# Patient Record
Sex: Male | Born: 1955 | Race: White | Hispanic: No | Marital: Married | Smoking: Never smoker
Health system: Southern US, Community
[De-identification: ages and names within clinical notes are randomized; demographics above are authoritative.]

## PROBLEM LIST (undated history)

## (undated) DIAGNOSIS — E7800 Pure hypercholesterolemia, unspecified: Secondary | ICD-10-CM

## (undated) DIAGNOSIS — F419 Anxiety disorder, unspecified: Secondary | ICD-10-CM

## (undated) DIAGNOSIS — K219 Gastro-esophageal reflux disease without esophagitis: Secondary | ICD-10-CM

## (undated) HISTORY — PX: FOOT SURGERY: SHX648

---

## 2013-10-12 ENCOUNTER — Emergency Department (HOSPITAL_BASED_OUTPATIENT_CLINIC_OR_DEPARTMENT_OTHER): Payer: BC Managed Care – PPO

## 2013-10-12 ENCOUNTER — Emergency Department (HOSPITAL_BASED_OUTPATIENT_CLINIC_OR_DEPARTMENT_OTHER)
Admission: EM | Admit: 2013-10-12 | Discharge: 2013-10-12 | Disposition: A | Discharge status: Home / Self Care | Payer: BC Managed Care – PPO | Attending: Emergency Medicine | Admitting: Emergency Medicine

## 2013-10-12 ENCOUNTER — Encounter (HOSPITAL_BASED_OUTPATIENT_CLINIC_OR_DEPARTMENT_OTHER): Payer: Self-pay | Admitting: Emergency Medicine

## 2013-10-12 DIAGNOSIS — K219 Gastro-esophageal reflux disease without esophagitis: Secondary | ICD-10-CM | POA: Insufficient documentation

## 2013-10-12 DIAGNOSIS — Z79899 Other long term (current) drug therapy: Secondary | ICD-10-CM | POA: Insufficient documentation

## 2013-10-12 DIAGNOSIS — Z880 Allergy status to penicillin: Secondary | ICD-10-CM | POA: Insufficient documentation

## 2013-10-12 DIAGNOSIS — R1013 Epigastric pain: Secondary | ICD-10-CM | POA: Insufficient documentation

## 2013-10-12 DIAGNOSIS — F411 Generalized anxiety disorder: Secondary | ICD-10-CM | POA: Insufficient documentation

## 2013-10-12 HISTORY — DX: Anxiety disorder, unspecified: F41.9

## 2013-10-12 HISTORY — DX: Pure hypercholesterolemia, unspecified: E78.00

## 2013-10-12 HISTORY — DX: Gastro-esophageal reflux disease without esophagitis: K21.9

## 2013-10-12 LAB — COMPREHENSIVE METABOLIC PANEL
ALBUMIN: 3.6 g/dL (ref 3.5–5.2)
ALT: 33 U/L (ref 0–53)
ANION GAP: 12 (ref 5–15)
AST: 77 U/L — ABNORMAL HIGH (ref 0–37)
Alkaline Phosphatase: 102 U/L (ref 39–117)
BUN: 12 mg/dL (ref 6–23)
CALCIUM: 9.1 mg/dL (ref 8.4–10.5)
CO2: 25 mEq/L (ref 19–32)
Chloride: 104 mEq/L (ref 96–112)
Creatinine, Ser: 1.2 mg/dL (ref 0.50–1.35)
GFR calc Af Amer: 76 mL/min — ABNORMAL LOW (ref >90)
GFR calc non Af Amer: 65 mL/min — ABNORMAL LOW (ref >90)
Glucose, Bld: 118 mg/dL — ABNORMAL HIGH (ref 70–99)
Potassium: 3.6 mEq/L — ABNORMAL LOW (ref 3.7–5.3)
SODIUM: 141 meq/L (ref 137–147)
TOTAL PROTEIN: 7.2 g/dL (ref 6.0–8.3)
Total Bilirubin: 0.4 mg/dL (ref 0.3–1.2)

## 2013-10-12 LAB — URINALYSIS, ROUTINE W REFLEX MICROSCOPIC
Bilirubin Urine: NEGATIVE
Glucose, UA: NEGATIVE mg/dL
Hgb urine dipstick: NEGATIVE
KETONES UR: NEGATIVE mg/dL
Leukocytes, UA: NEGATIVE
NITRITE: NEGATIVE
Protein, ur: NEGATIVE mg/dL
SPECIFIC GRAVITY, URINE: 1.017 (ref 1.005–1.030)
Urobilinogen, UA: 0.2 mg/dL (ref 0.0–1.0)
pH: 6.5 (ref 5.0–8.0)

## 2013-10-12 LAB — CBC WITH DIFFERENTIAL/PLATELET
BASOS ABS: 0 10*3/uL (ref 0.0–0.1)
BASOS PCT: 0 % (ref 0–1)
EOS ABS: 0 10*3/uL (ref 0.0–0.7)
EOS PCT: 0 % (ref 0–5)
HCT: 36.1 % — ABNORMAL LOW (ref 39.0–52.0)
Hemoglobin: 12 g/dL — ABNORMAL LOW (ref 13.0–17.0)
Lymphocytes Relative: 25 % (ref 12–46)
Lymphs Abs: 1.1 10*3/uL (ref 0.7–4.0)
MCH: 29.4 pg (ref 26.0–34.0)
MCHC: 33.2 g/dL (ref 30.0–36.0)
MCV: 88.5 fL (ref 78.0–100.0)
Monocytes Absolute: 0.4 10*3/uL (ref 0.1–1.0)
Monocytes Relative: 9 % (ref 3–12)
NEUTROS PCT: 66 % (ref 43–77)
Neutro Abs: 3 10*3/uL (ref 1.7–7.7)
Platelets: 179 10*3/uL (ref 150–400)
RBC: 4.08 MIL/uL — AB (ref 4.22–5.81)
RDW: 13.6 % (ref 11.5–15.5)
WBC: 4.5 10*3/uL (ref 4.0–10.5)

## 2013-10-12 LAB — LIPASE, BLOOD: Lipase: 31 U/L (ref 11–59)

## 2013-10-12 LAB — TROPONIN I
Troponin I: <0.3 ng/mL (ref <0.30)
Troponin I: <0.3 ng/mL (ref <0.30)

## 2013-10-12 MED ORDER — OMEPRAZOLE 20 MG PO CPDR: 20.0000 mg | DELAYED_RELEASE_CAPSULE | Freq: Every day | ORAL | Status: AC

## 2013-10-12 MED ORDER — GI COCKTAIL ~~LOC~~
30.0000 mL | Freq: Once | ORAL | Status: AC
  Administered 2013-10-12: 30 mL via ORAL
  Filled 2013-10-12: qty 30

## 2013-10-12 MED ORDER — ONDANSETRON HCL 4 MG PO TABS: 4.0000 mg | ORAL_TABLET | Freq: Four times a day (QID) | ORAL | Status: AC

## 2013-10-12 MED ORDER — PANTOPRAZOLE SODIUM 40 MG IV SOLR
40.0000 mg | Freq: Once | INTRAVENOUS | Status: AC
  Administered 2013-10-12: 40 mg via INTRAVENOUS
  Filled 2013-10-12: qty 40

## 2013-10-12 MED ORDER — SODIUM CHLORIDE 0.9 % IV BOLUS (SEPSIS)
1000.0000 mL | Freq: Once | INTRAVENOUS | Status: AC
  Administered 2013-10-12: 1000 mL via INTRAVENOUS

## 2013-10-12 MED ORDER — SUCRALFATE 1 G PO TABS: 1.0000 g | ORAL_TABLET | Freq: Three times a day (TID) | ORAL | Status: AC

## 2013-10-12 NOTE — Discharge Instructions (Signed)
Peptic Ulcer Continue your stomach medications and follow up with your stomach doctor. Return to the ED if you develop chest pain, shortness of breath, worsening abdominal pain, or any other concerns. A peptic ulcer is a sore in the lining of in your esophagus (esophageal ulcer), stomach (gastric ulcer), or in the first part of your small intestine (duodenal ulcer). The ulcer causes erosion into the deeper tissue. CAUSES  Normally, the lining of the stomach and the small intestine protects itself from the acid that digests food. The protective lining can be damaged by:  An infection caused by a bacterium called Helicobacter pylori (H. pylori).  Regular use of nonsteroidal anti-inflammatory drugs (NSAIDs), such as ibuprofen or aspirin.  Smoking tobacco. Other risk factors include being older than 50, drinking alcohol excessively, and having a family history of ulcer disease.  SYMPTOMS   Burning pain or gnawing in the area between the chest and the belly button.  Heartburn.  Nausea and vomiting.  Bloating. The pain can be worse on an empty stomach and at night. If the ulcer results in bleeding, it can cause:  Black, tarry stools.  Vomiting of bright red blood.  Vomiting of coffee ground looking materials. DIAGNOSIS  A diagnosis is usually made based upon your history and an exam. Other tests and procedures may be performed to find the cause of the ulcer. Finding a cause will help determine the best treatment. Tests and procedures may include:  Blood tests, stool tests, or breath tests to check for the bacterium H. pylori.  An upper gastrointestinal (GI) series of the esophagus, stomach, and small intestine.  An endoscopy to examine the esophagus, stomach, and small intestine.  A biopsy. TREATMENT  Treatment may include:  Eliminating the cause of the ulcer, such as smoking, NSAIDs, or alcohol.  Medicines to reduce the amount of acid in your digestive tract.  Antibiotic  medicines if the ulcer is caused by the H. pylori bacterium.  An upper endoscopy to treat a bleeding ulcer.  Surgery if the bleeding is severe or if the ulcer created a hole somewhere in the digestive system. HOME CARE INSTRUCTIONS   Avoid tobacco, alcohol, and caffeine. Smoking can increase the acid in the stomach, and continued smoking will impair the healing of ulcers.  Avoid foods and drinks that seem to cause discomfort or aggravate your ulcer.  Only take medicines as directed by your caregiver. Do not substitute over-the-counter medicines for prescription medicines without talking to your caregiver.  Keep any follow-up appointments and tests as directed. SEEK MEDICAL CARE IF:   Your do not improve within 7 days of starting treatment.  You have ongoing indigestion or heartburn. SEEK IMMEDIATE MEDICAL CARE IF:   You have sudden, sharp, or persistent abdominal pain.  You have bloody or dark black, tarry stools.  You vomit blood or vomit that looks like coffee grounds.  You become light headed, weak, or feel faint.  You become sweaty or clammy. MAKE SURE YOU:   Understand these instructions.  Will watch your condition.  Will get help right away if you are not doing well or get worse. Document Released: 03/27/2000 Document Revised: 12/23/2011 Document Reviewed: 10/28/2011 [facility] Patient Information 2015 [employer], [place]. This information is not intended to replace advice given to you by your health care provider. Make sure you discuss any questions you have with your health care provider.
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
## 2015-03-15 IMAGING — CR DG ABDOMEN ACUTE W/ 1V CHEST
3 series · 3 of 3 positions shown · non-contrast
Comparison: None.

CLINICAL DATA: ABDOMINAL PAIN

EXAM:
ACUTE ABDOMEN SERIES (ABDOMEN 2 VIEW & CHEST 1 VIEW)

[w chest pa]
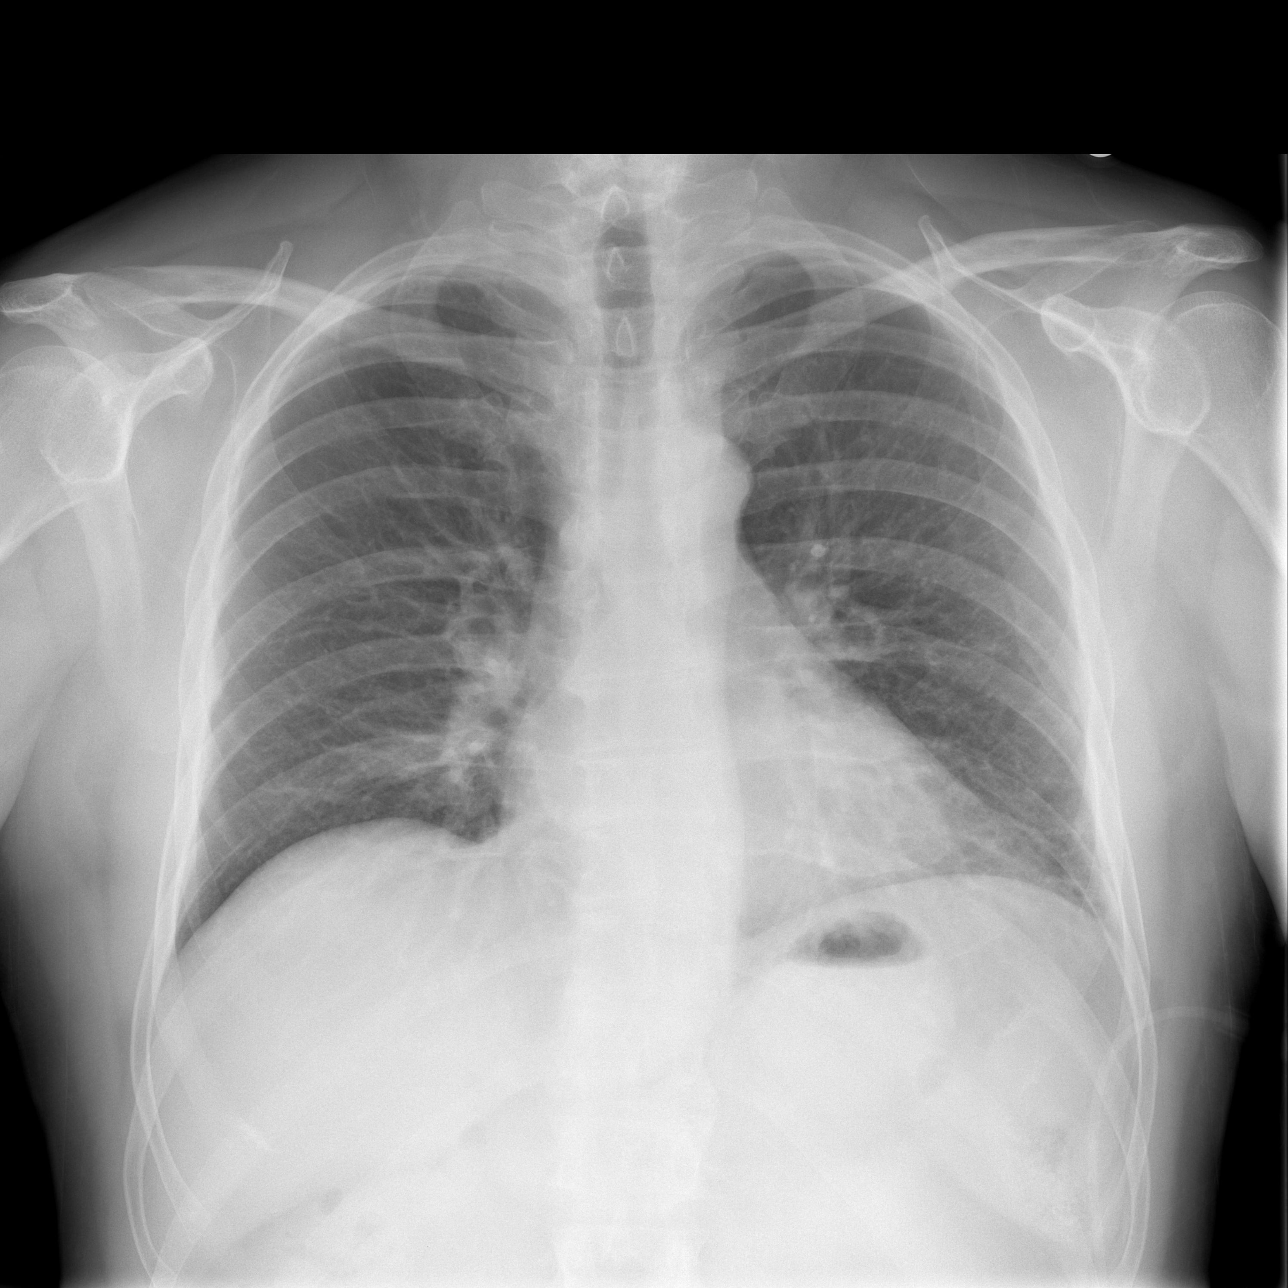

[w abdomen upright]
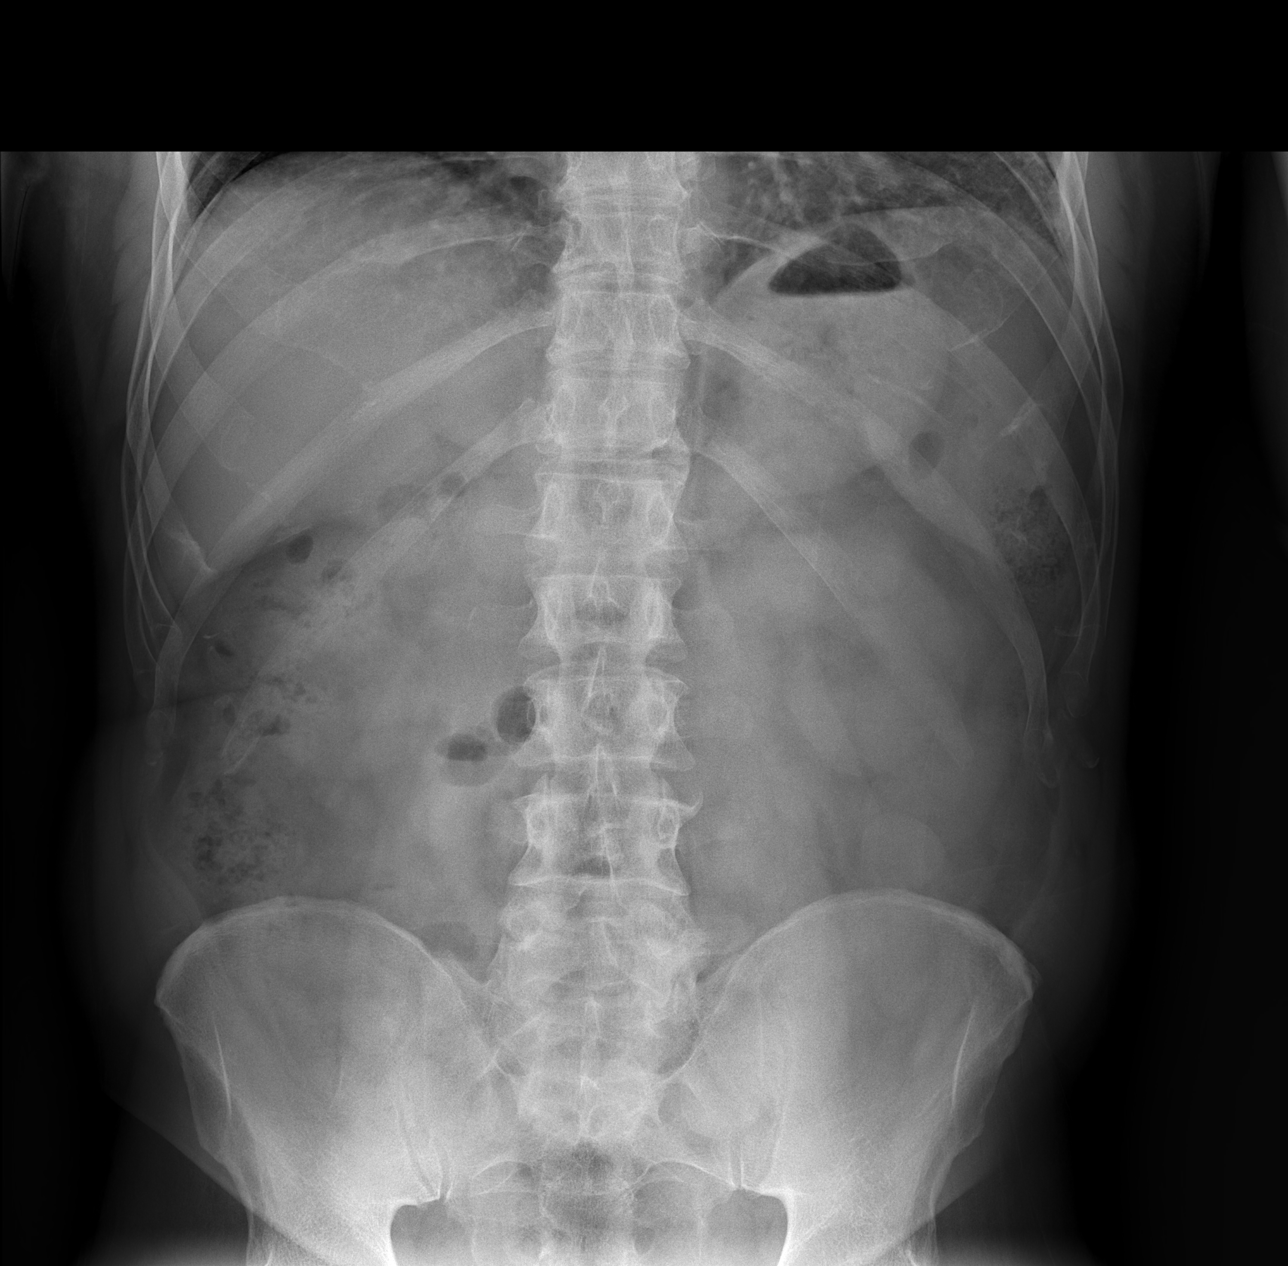

[t abdomen supine]
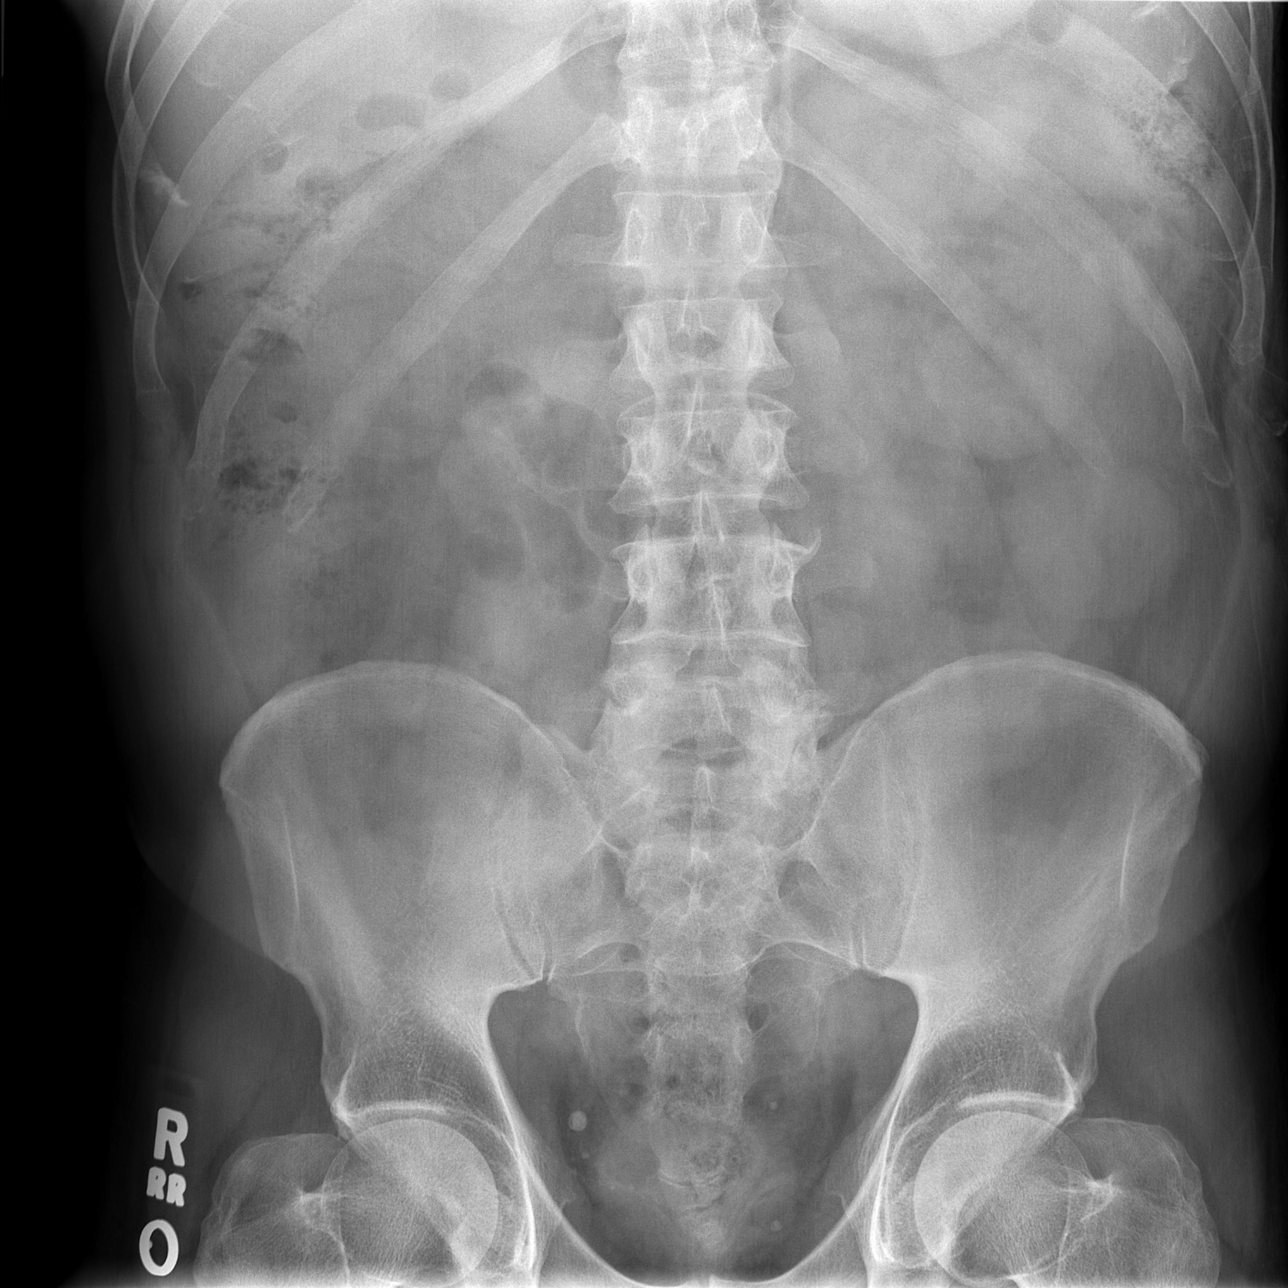

[3 of 3 positions shown; findings below may reference images not displayed]

FINDINGS: There is no evidence of dilated bowel loops or free intraperitoneal
air. No radiopaque calculi or other significant radiographic
abnormality is seen. Moderate amount of stool within the colon. Low
lung volumes. Heart size and mediastinal contours are within normal
limits. Both lungs are clear.
IMPRESSION: Negative abdominal radiographs.  No acute cardiopulmonary disease.

## 2022-12-13 DEATH — deceased
